# Patient Record
Sex: Female | Born: 1985 | Race: White | Marital: Married
Health system: Southern US, Community
[De-identification: ages and names within clinical notes are randomized; demographics above are authoritative.]

---

## 2011-06-08 ENCOUNTER — Other Ambulatory Visit (HOSPITAL_COMMUNITY): Payer: Self-pay | Admitting: *Deleted

## 2011-06-08 DIAGNOSIS — Z3689 Encounter for other specified antenatal screening: Secondary | ICD-10-CM

## 2011-06-08 DIAGNOSIS — O4690 Antepartum hemorrhage, unspecified, unspecified trimester: Secondary | ICD-10-CM

## 2011-06-08 DIAGNOSIS — O26879 Cervical shortening, unspecified trimester: Secondary | ICD-10-CM

## 2011-06-19 ENCOUNTER — Other Ambulatory Visit (HOSPITAL_COMMUNITY): Payer: Self-pay | Admitting: *Deleted

## 2011-06-19 ENCOUNTER — Ambulatory Visit (HOSPITAL_COMMUNITY)
Admission: RE | Admit: 2011-06-19 | Discharge: 2011-06-19 | Disposition: A | Discharge status: Home / Self Care | Payer: BC Managed Care – PPO | Source: Ambulatory Visit | Attending: Obstetrics and Gynecology | Admitting: Obstetrics and Gynecology

## 2011-06-19 ENCOUNTER — Encounter (HOSPITAL_COMMUNITY): Payer: Self-pay

## 2011-06-19 DIAGNOSIS — O4690 Antepartum hemorrhage, unspecified, unspecified trimester: Secondary | ICD-10-CM

## 2011-06-19 DIAGNOSIS — O26879 Cervical shortening, unspecified trimester: Secondary | ICD-10-CM | POA: Insufficient documentation

## 2011-06-19 DIAGNOSIS — Z3689 Encounter for other specified antenatal screening: Secondary | ICD-10-CM

## 2011-06-22 ENCOUNTER — Telehealth: Payer: Self-pay | Admitting: Family Medicine

## 2011-06-22 NOTE — Telephone Encounter (Signed)
Closed

## 2011-06-26 ENCOUNTER — Other Ambulatory Visit (HOSPITAL_COMMUNITY): Payer: Self-pay | Admitting: *Deleted

## 2011-06-26 DIAGNOSIS — Z09 Encounter for follow-up examination after completed treatment for conditions other than malignant neoplasm: Secondary | ICD-10-CM

## 2011-06-26 DIAGNOSIS — N883 Incompetence of cervix uteri: Secondary | ICD-10-CM

## 2011-07-06 ENCOUNTER — Ambulatory Visit (HOSPITAL_COMMUNITY)
Admission: RE | Admit: 2011-07-06 | Discharge: 2011-07-06 | Disposition: A | Discharge status: Home / Self Care | Payer: BC Managed Care – PPO | Source: Ambulatory Visit | Attending: Maternal and Fetal Medicine | Admitting: Maternal and Fetal Medicine

## 2011-07-06 DIAGNOSIS — Z3689 Encounter for other specified antenatal screening: Secondary | ICD-10-CM | POA: Insufficient documentation

## 2011-07-06 DIAGNOSIS — N883 Incompetence of cervix uteri: Secondary | ICD-10-CM

## 2011-07-06 DIAGNOSIS — O26879 Cervical shortening, unspecified trimester: Secondary | ICD-10-CM | POA: Insufficient documentation

## 2011-09-25 ENCOUNTER — Encounter (HOSPITAL_COMMUNITY): Payer: Self-pay

## 2012-02-09 IMAGING — US US OB TRANSVAGINAL
1 series · 12 of 12 positions shown · non-contrast
Comparison: none

[Series 1: us ob transvaginal · 12 acquisitions, 12 frames shown]
[im 1/12]
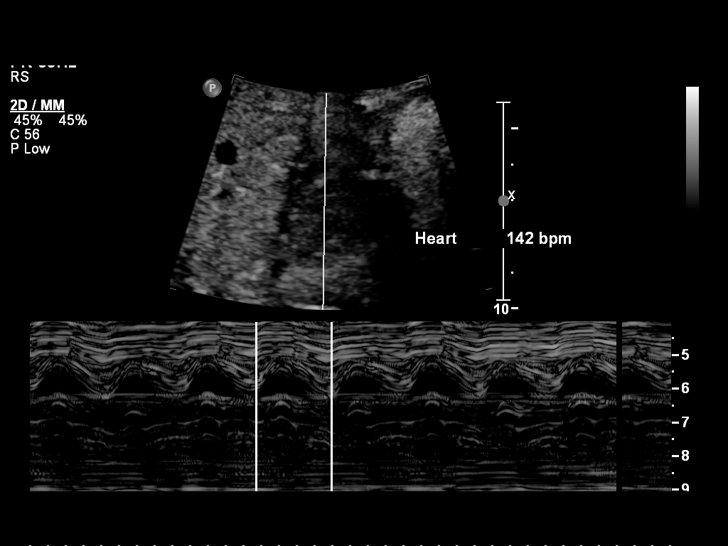
[im 2/12]
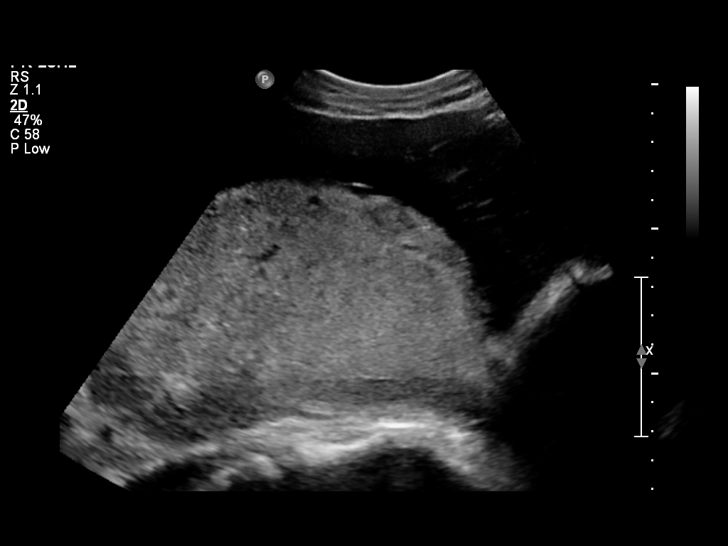
[im 3/12]
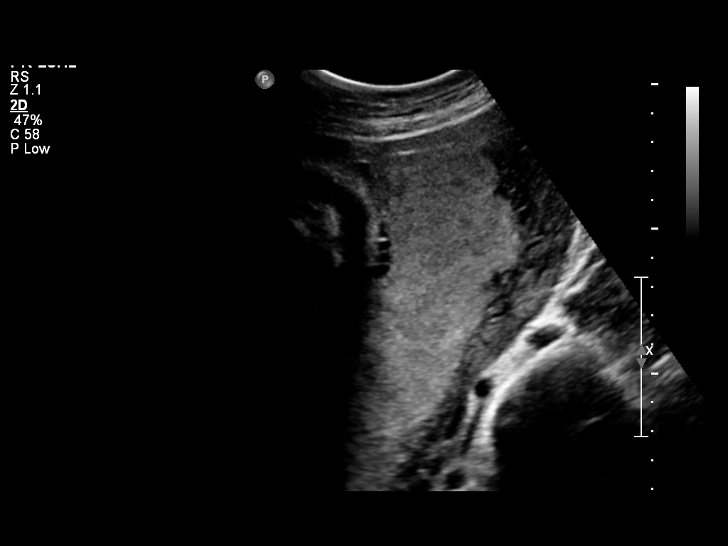
[im 4/12]
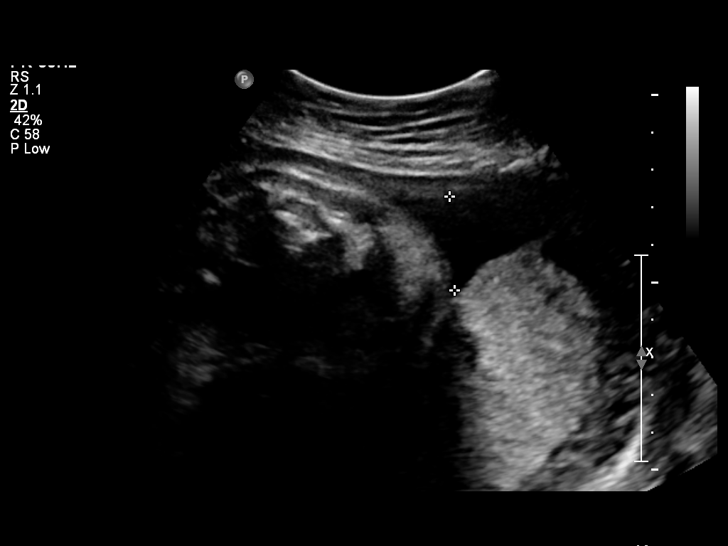
[im 5/12]
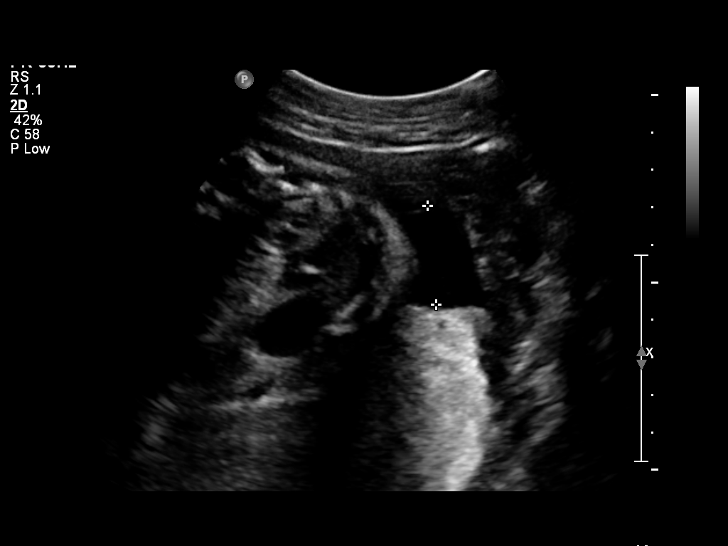
[im 6/12]
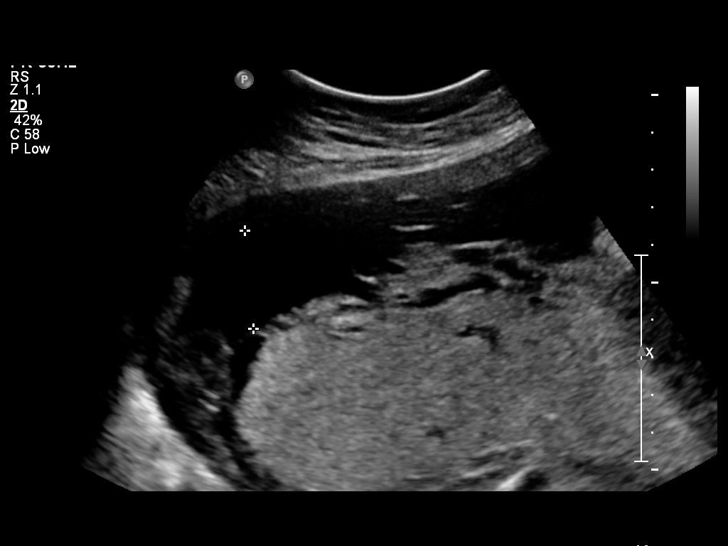
[im 7/12]
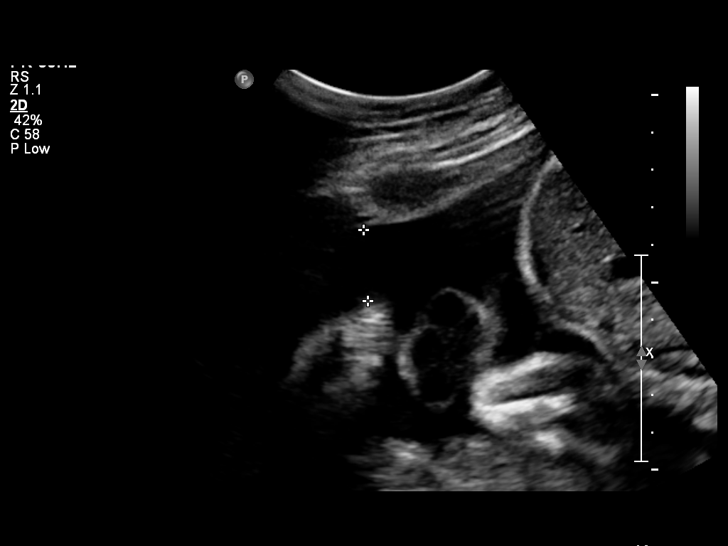
[im 8/12]
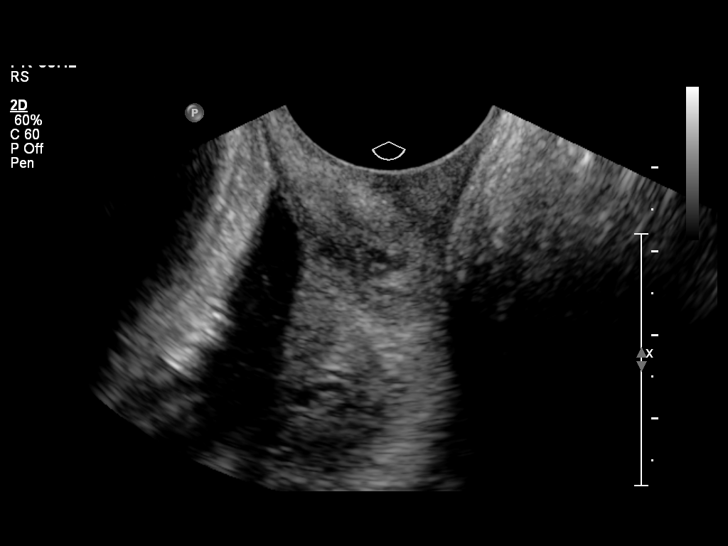
[im 9/12]
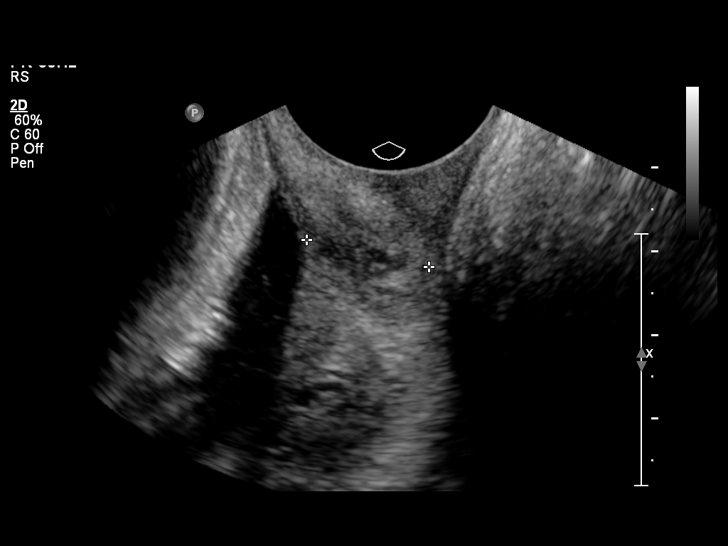
[im 10/12]
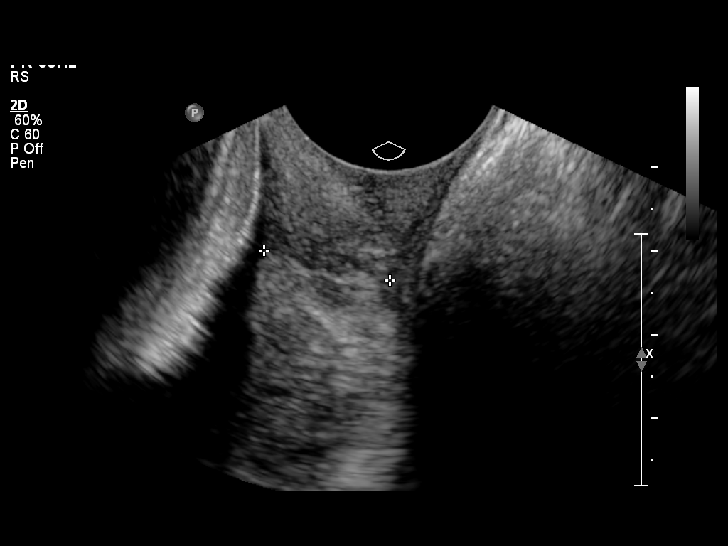
[im 11/12]
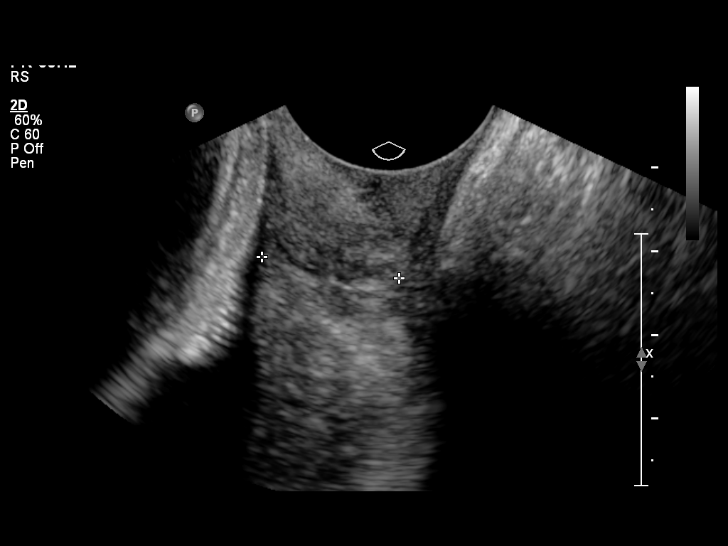
[im 12/12]
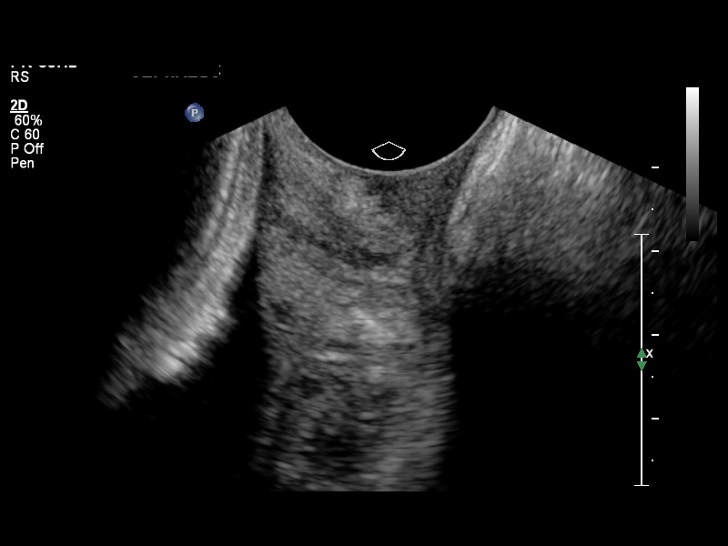

[12 of 12 positions shown; findings below may reference images not displayed]

Canned report from images found in remote index.

Refer to host system for actual result text.

## 2014-10-11 ENCOUNTER — Encounter (HOSPITAL_COMMUNITY): Payer: Self-pay

## 2020-02-25 ENCOUNTER — Ambulatory Visit: Payer: Self-pay

## 2020-02-27 ENCOUNTER — Ambulatory Visit: Payer: BC Managed Care – PPO | Attending: Internal Medicine

## 2020-02-27 DIAGNOSIS — Z23 Encounter for immunization: Secondary | ICD-10-CM

## 2020-02-27 NOTE — Progress Notes (Signed)
   [facility] Vaccination [facility]  Name:  [name]    MRN: [id number] DOB: [birth year]  02/27/2020  Diane Contreras was observed post Covid-19 immunization for 15 minutes without incident. She was provided with Vaccine Information Sheet and instruction to access the V-Safe system.   [name] was instructed to call 911 with any severe reactions post vaccine: Marland Kitchen Difficulty breathing  . Swelling of face and throat  . A fast heartbeat  . A bad rash all over body  . Dizziness and weakness   Immunizations Administered    Name Date Dose VIS Date Route   Moderna COVID-19 Vaccine 02/27/2020  9:19 AM 0.5 mL 11/10/2019 Intramuscular   Manufacturer: Moderna   Lot: [id number]   NDC: 15945-859-29

## 2020-03-30 ENCOUNTER — Ambulatory Visit: Payer: BC Managed Care – PPO | Attending: Internal Medicine

## 2020-03-30 DIAGNOSIS — Z23 Encounter for immunization: Secondary | ICD-10-CM

## 2020-03-30 NOTE — Progress Notes (Signed)
   Covid-[facility] Vaccination Clinic  Name:  TAMETRA AHART    MRN: [id number] DOB: 1986-08-12  03/30/2020  Ms. Parchment was observed post Covid-19 immunization for 15 minutes without incident. She was provided with Vaccine Information Sheet and instruction to access the V-Safe system.   Ms. Broman was instructed to call 911 with any severe reactions post vaccine: Marland Kitchen Difficulty breathing  . Swelling of face and throat  . A fast heartbeat  . A bad rash all over body  . Dizziness and weakness   Immunizations Administered    Name Date Dose VIS Date Route   Moderna COVID-19 Vaccine 03/30/2020  2:52 PM 0.5 mL 11/2019 Intramuscular   Manufacturer: Moderna   Lot: [id number]   NDC: 18299-371-69
